# Patient Record
Sex: Female | Born: 1995 | Race: Black or African American | Hispanic: No | Marital: Single | Smoking: Never smoker
Health system: Southern US, Community
[De-identification: ages and names within clinical notes are randomized; demographics above are authoritative.]

## PROBLEM LIST (undated history)

## (undated) DIAGNOSIS — J45909 Unspecified asthma, uncomplicated: Secondary | ICD-10-CM

## (undated) HISTORY — PX: WISDOM TOOTH EXTRACTION: SHX21

---

## 2017-01-11 ENCOUNTER — Emergency Department: Payer: Worker's Compensation

## 2017-01-11 ENCOUNTER — Encounter: Payer: Self-pay | Admitting: Emergency Medicine

## 2017-01-11 ENCOUNTER — Emergency Department
Admission: EM | Admit: 2017-01-11 | Discharge: 2017-01-11 | Disposition: A | Discharge status: Home / Self Care | Payer: Worker's Compensation | Attending: Emergency Medicine | Admitting: Emergency Medicine

## 2017-01-11 DIAGNOSIS — Y92219 Unspecified school as the place of occurrence of the external cause: Secondary | ICD-10-CM | POA: Insufficient documentation

## 2017-01-11 DIAGNOSIS — Y939 Activity, unspecified: Secondary | ICD-10-CM | POA: Diagnosis not present

## 2017-01-11 DIAGNOSIS — S40022A Contusion of left upper arm, initial encounter: Secondary | ICD-10-CM | POA: Diagnosis not present

## 2017-01-11 DIAGNOSIS — Y998 Other external cause status: Secondary | ICD-10-CM | POA: Insufficient documentation

## 2017-01-11 DIAGNOSIS — J45909 Unspecified asthma, uncomplicated: Secondary | ICD-10-CM | POA: Diagnosis not present

## 2017-01-11 DIAGNOSIS — S4992XA Unspecified injury of left shoulder and upper arm, initial encounter: Secondary | ICD-10-CM | POA: Diagnosis present

## 2017-01-11 HISTORY — DX: Unspecified asthma, uncomplicated: J45.909

## 2017-01-11 MED ORDER — NAPROXEN 500 MG PO TABS
500.0000 mg | ORAL_TABLET | Freq: Once | ORAL | Status: AC
  Administered 2017-01-11: 500 mg via ORAL
  Filled 2017-01-11: qty 1

## 2017-01-11 MED ORDER — NAPROXEN 500 MG PO TABS: 500.0000 mg | ORAL_TABLET | Freq: Two times a day (BID) | ORAL | Status: DC
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
## 2017-01-11 NOTE — Discharge Instructions (Signed)
Arm sling for 2-3 days as needed °

## 2017-09-20 ENCOUNTER — Ambulatory Visit: Payer: BC Managed Care – PPO | Admitting: Allergy and Immunology

## 2017-09-20 ENCOUNTER — Encounter: Payer: Self-pay | Admitting: Allergy and Immunology

## 2017-09-20 VITALS — BP 108/70 | HR 80 | Temp 98.0°F | Resp 17 | Ht 62.0 in | Wt 115.0 lb

## 2017-09-20 DIAGNOSIS — J3089 Other allergic rhinitis: Secondary | ICD-10-CM | POA: Insufficient documentation

## 2017-09-20 DIAGNOSIS — J4540 Moderate persistent asthma, uncomplicated: Secondary | ICD-10-CM | POA: Insufficient documentation

## 2017-09-20 MED ORDER — MONTELUKAST SODIUM 10 MG PO TABS: 10.0000 mg | ORAL_TABLET | Freq: Every day | ORAL | 5 refills | Status: DC

## 2017-09-20 MED ORDER — FLUTICASONE PROPIONATE HFA 110 MCG/ACT IN AERO: 2.0000 | INHALATION_SPRAY | Freq: Two times a day (BID) | RESPIRATORY_TRACT | 5 refills | Status: DC

## 2017-09-20 MED ORDER — MOMETASONE FUROATE 50 MCG/ACT NA SUSP: 1.0000 | Freq: Two times a day (BID) | NASAL | 5 refills | Status: DC | PRN

## 2017-09-20 NOTE — Patient Instructions (Addendum)
Moderate persistent asthma Currently with suboptimal control.  In addition, today's spirometry results, assessed while asymptomatic, suggest under-perception of bronchoconstriction.  A prescription has been provided for Flovent (fluticasone) 110 g,  2 inhalations twice a day. To maximize pulmonary deposition, a spacer has been provided along with instructions for its proper administration with an HFA inhaler.  A prescription has been provided for montelukast 10 mg daily at bedtime.   Continue albuterol HFA, 1 to 2 inhalations every 6 hours if needed.  Subjective and objective measures of pulmonary function will be followed and the treatment plan will be adjusted accordingly.  Seasonal and perennial allergic rhinitis  Aeroallergen avoidance measures have been discussed and provided in written form.  Montelukast has been prescribed (as above).  A prescription has been provided for Nasonex nasal spray, one spray per nostril 1-2 times daily as needed. Proper nasal spray technique has been discussed and demonstrated.  Nasal saline spray (i.e., Simply Saline) or nasal saline lavage (i.e., NeilMed) is recommended as needed and prior to medicated nasal sprays.  If allergen avoidance measures and medications fail to adequately relieve symptoms, aeroallergen immunotherapy will be considered.   Return in about 3 months (around 12/20/2017), or if symptoms worsen or fail to improve.  Reducing Pollen Exposure  The American Academy of Allergy, Asthma and Immunology suggests the following steps to reduce your exposure to pollen during allergy seasons.    1. Do not hang sheets or clothing out to dry; pollen may collect on these items. 2. Do not mow lawns or spend time around freshly cut grass; mowing stirs up pollen. 3. Keep windows closed at night.  Keep car windows closed while driving. 4. Minimize morning activities outdoors, a time when pollen counts are usually at their highest. 5. Stay indoors  as much as possible when pollen counts or humidity is high and on windy days when pollen tends to remain in the air longer. 6. Use air conditioning when possible.  Many air conditioners have filters that trap the pollen spores. 7. Use a HEPA room air filter to remove pollen form the indoor air you breathe.   Control of House Dust Mite Allergen  House dust mites play a major role in allergic asthma and rhinitis.  They occur in environments with high humidity wherever human skin, the food for dust mites is found. High levels have been detected in dust obtained from mattresses, pillows, carpets, upholstered furniture, bed covers, clothes and soft toys.  The principal allergen of the house dust mite is found in its feces.  A gram of dust may contain 1,000 mites and 250,000 fecal particles.  Mite antigen is easily measured in the air during house cleaning activities.    1. Encase mattresses, including the box spring, and pillow, in an air tight cover.  Seal the zipper end of the encased mattresses with wide adhesive tape. 2. Wash the bedding in water of 130 degrees Farenheit weekly.  Avoid cotton comforters/quilts and flannel bedding: the most ideal bed covering is the dacron comforter. 3. Remove all upholstered furniture from the bedroom. 4. Remove carpets, carpet padding, rugs, and non-washable window drapes from the bedroom.  Wash drapes weekly or use plastic window coverings. 5. Remove all non-washable stuffed toys from the bedroom.  Wash stuffed toys weekly. 6. Have the room cleaned frequently with a vacuum cleaner and a damp dust-mop.  The patient should not be in a room which is being cleaned and should wait 1 hour after cleaning before going into  the room. 7. Close and seal all heating outlets in the bedroom.  Otherwise, the room will become filled with dust-laden air.  An electric heater can be used to heat the room. Reduce indoor humidity to less than 50%.  Do not use a humidifier.  Control  of Dog or Cat Allergen  Avoidance is the best way to manage a dog or cat allergy. If you have a dog or cat and are allergic to dog or cats, consider removing the dog or cat from the home. If you have a dog or cat but don't want to find it a new home, or if your family wants a pet even though someone in the household is allergic, here are some strategies that may help keep symptoms at bay:  1. Keep the pet out of your bedroom and restrict it to only a few rooms. Be advised that keeping the dog or cat in only one room will not limit the allergens to that room. 2. Don't pet, hug or kiss the dog or cat; if you do, wash your hands with soap and water. 3. High-efficiency particulate air (HEPA) cleaners run continuously in a bedroom or living room can reduce allergen levels over time. 4. Place electrostatic material sheet in the air inlet vent in the bedroom. 5. Regular use of a high-efficiency vacuum cleaner or a central vacuum can reduce allergen levels. 6. Giving your dog or cat a bath at least once a week can reduce airborne allergen.  Control of Mold Allergen  Mold and fungi can grow on a variety of surfaces provided certain temperature and moisture conditions exist.  Outdoor molds grow on plants, decaying vegetation and soil.  The major outdoor mold, Alternaria and Cladosporium, are found in very high numbers during hot and dry conditions.  Generally, a late Summer - Fall peak is seen for common outdoor fungal spores.  Rain will temporarily lower outdoor mold spore count, but counts rise rapidly when the rainy period ends.  The most important indoor molds are Aspergillus and Penicillium.  Dark, humid and poorly ventilated basements are ideal sites for mold growth.  The next most common sites of mold growth are the bathroom and the kitchen.  Outdoor [employer] 1. Use air conditioning and keep windows closed 2. Avoid exposure to decaying vegetation. 3. Avoid leaf raking. 4. Avoid grain  handling. 5. Consider wearing a face mask if working in moldy areas.  Indoor Mold Control 1. Maintain humidity below 50%. 2. Clean washable surfaces with 5% bleach solution. 3. Remove sources e.g. Contaminated carpets.  Control of Cockroach Allergen  Cockroach allergen has been identified as an important cause of acute attacks of asthma, especially in urban settings.  There are fifty-five species of cockroach that exist in the [place], however only three, the [place], [place] species produce allergen that can affect patients with Asthma.  Allergens can be obtained from fecal particles, egg casings and secretions from cockroaches.    1. Remove food sources. 2. Reduce access to water. 3. Seal access and entry points. 4. Spray runways with 0.5-1% Diazinon or Chlorpyrifos 5. Blow boric acid power under stoves and refrigerator. 6. Place bait stations (hydramethylnon) at feeding sites.

## 2017-09-20 NOTE — Assessment & Plan Note (Signed)
   Aeroallergen avoidance measures have been discussed and provided in written form.  Montelukast has been prescribed (as above).  A prescription has been provided for Nasonex nasal spray, one spray per nostril 1-2 times daily as needed. Proper nasal spray technique has been discussed and demonstrated.  Nasal saline spray (i.e., Simply Saline) or nasal saline lavage (i.e., NeilMed) is recommended as needed and prior to medicated nasal sprays.  If allergen avoidance measures and medications fail to adequately relieve symptoms, aeroallergen immunotherapy will be considered.

## 2017-09-20 NOTE — Assessment & Plan Note (Signed)
Currently with suboptimal control.  In addition, today's spirometry results, assessed while asymptomatic, suggest under-perception of bronchoconstriction.  A prescription has been provided for Flovent (fluticasone) 110 g, 2 inhalations twice a day. To maximize pulmonary deposition, a spacer has been provided along with instructions for its proper administration with an HFA inhaler.  A prescription has been provided for montelukast 10 mg daily at bedtime.   Continue albuterol HFA, 1 to 2 inhalations every 6 hours if needed.  Subjective and objective measures of pulmonary function will be followed and the treatment plan will be adjusted accordingly.

## 2017-09-20 NOTE — Progress Notes (Signed)
New Patient Note  RE: Kaitlin Sanchez MRN: [id number] DOB: December 17, 1995 Date of Office Visit: 09/20/2017  Referring provider: No ref. provider found Primary care provider: Patient, No Pcp Per  Chief Complaint: Asthma   History of present illness: Kaitlin Sanchez is a 22 y.o. female presenting today for evaluation of asthma. She reports that she was diagnosed with asthma during childhood.  The asthma symptoms progressed in frequency and severity and she was prescribed albuterol at the age 54.  She currently requires albuterol rescue 1-2 times per day on average during the pollen season and experiences nocturnal awakenings due to lower respiratory symptoms one night per week on average.  Specific triggers for her asthma symptoms include heat, upper respiratory tract infections, exposure to pollen, and exposure to cigarette smoke.  She experiences occasional nasal congestion.  No significant seasonal symptom variation has been noted nor have specific environmental triggers been identified.  She was diagnosed with asthma in childhood.  She currently requires albuterol rescue with exposure to pollens and cigarette smoke.  She does not experience nocturnal awakenings due to lower respiratory symptoms.  Assessment and plan: Moderate persistent asthma Currently with suboptimal control.  In addition, today's spirometry results, assessed while asymptomatic, suggest under-perception of bronchoconstriction.  A prescription has been provided for Flovent (fluticasone) 110 g,  2 inhalations twice a day. To maximize pulmonary deposition, a spacer has been provided along with instructions for its proper administration with an HFA inhaler.  A prescription has been provided for montelukast 10 mg daily at bedtime.   Continue albuterol HFA, 1 to 2 inhalations every 6 hours if needed.  Subjective and objective measures of pulmonary function will be followed and the treatment plan will be adjusted  accordingly.  Seasonal and perennial allergic rhinitis  Aeroallergen avoidance measures have been discussed and provided in written form.  Montelukast has been prescribed (as above).  A prescription has been provided for Nasonex nasal spray, one spray per nostril 1-2 times daily as needed. Proper nasal spray technique has been discussed and demonstrated.  Nasal saline spray (i.e., Simply Saline) or nasal saline lavage (i.e., NeilMed) is recommended as needed and prior to medicated nasal sprays.  If allergen avoidance measures and medications fail to adequately relieve symptoms, aeroallergen immunotherapy will be considered.   Meds ordered this encounter  Medications  . montelukast (SINGULAIR) 10 MG tablet    Sig: Take 1 tablet (10 mg total) by mouth at bedtime.    Dispense:  30 tablet    Refill:  5  . fluticasone (FLOVENT HFA) 110 MCG/ACT inhaler    Sig: Inhale 2 puffs into the lungs 2 (two) times daily.    Dispense:  1 Inhaler    Refill:  5  . mometasone (NASONEX) 50 MCG/ACT nasal spray    Sig: Place 1 spray into the nose 2 (two) times daily as needed.    Dispense:  17 g    Refill:  5    Diagnostics: Spirometry: FVC was 3.58 L and FEV1 was 2.59 L, FEV1 ratio of 86%.  There was significant (320 mL, 12%) postbronchodilator improvement.  This study was performed while the patient was asymptomatic.  Please see scanned spirometry results for details. Epicutaneous testing: Positive to grass pollen, weed pollen, ragweed pollen, tree pollen, molds, cat hair, and dust mite antigen. Intradermal testing: Positive to cockroach antigen.    Physical examination: Blood pressure 108/70, pulse 80, temperature 98 F (36.7 C), resp. rate 17, height 5\' 2"  (1.575 m), weight 115  lb (52.2 kg), SpO2 99 %.  General: Alert, interactive, in no acute distress. HEENT: TMs pearly gray, turbinates edematous with clear discharge, post-pharynx moderately erythematous. Neck: Supple without  lymphadenopathy. Lungs: Clear to auscultation without wheezing, rhonchi or rales. CV: Normal S1, S2 without murmurs. Abdomen: Nondistended, nontender. Skin: Warm and dry, without lesions or rashes. Extremities:  No clubbing, cyanosis or edema. Neuro:   Grossly intact.  Review of systems:  Review of systems negative except as noted in HPI / PMHx or noted below: Review of Systems  Constitutional: Negative.   HENT: Negative.   Eyes: Negative.   Respiratory: Negative.   Cardiovascular: Negative.   Gastrointestinal: Negative.   Genitourinary: Negative.   Musculoskeletal: Negative.   Skin: Negative.   Neurological: Negative.   Endo/Heme/Allergies: Negative.   Psychiatric/Behavioral: Negative.     Past medical history:  Past Medical History:  Diagnosis Date  . Asthma     Past surgical history:  Past Surgical History:  Procedure Laterality Date  . WISDOM TOOTH EXTRACTION      Family history: Family History  Problem Relation Age of Onset  . Asthma Father   . Asthma Brother   . Allergic rhinitis Brother     Social history: Social History   Socioeconomic History  . Marital status: Single    Spouse name: Not on file  . Number of children: Not on file  . Years of education: Not on file  . Highest education level: Not on file  Occupational History  . Not on file  Social Needs  . Financial resource strain: Not on file  . Food insecurity:    Worry: Not on file    Inability: Not on file  . Transportation needs:    Medical: Not on file    Non-medical: Not on file  Tobacco Use  . Smoking status: Never Smoker  . Smokeless tobacco: Never Used  Substance and Sexual Activity  . Alcohol use: Yes  . Drug use: Not on file  . Sexual activity: Not on file  Lifestyle  . Physical activity:    Days per week: Not on file    Minutes per session: Not on file  . Stress: Not on file  Relationships  . Social connections:    Talks on phone: Not on file    Gets together: Not  on file    Attends religious service: Not on file    Active member of club or organization: Not on file    Attends meetings of clubs or organizations: Not on file    Relationship status: Not on file  . Intimate partner violence:    Fear of current or ex partner: Not on file    Emotionally abused: Not on file    Physically abused: Not on file    Forced sexual activity: Not on file  Other Topics Concern  . Not on file  Social History Narrative  . Not on file   Environmental History: The patient lives in a townhouse with carpeting throughout and central air/heat.  There is no known mold/water damage in the home.  She is a non-smoker without pets.  Allergies as of 09/20/2017   No Known Allergies     Medication List        Accurate as of 09/20/17  6:35 PM. Always use your most recent med list.          fluticasone 110 MCG/ACT inhaler Commonly known as:  FLOVENT HFA Inhale 2 puffs into the lungs 2 (two)  times daily.   mometasone 50 MCG/ACT nasal spray Commonly known as:  NASONEX Place 1 spray into the nose 2 (two) times daily as needed.   montelukast 10 MG tablet Commonly known as:  SINGULAIR Take 1 tablet (10 mg total) by mouth at bedtime.   PROVENTIL HFA 108 (90 Base) MCG/ACT inhaler Generic drug:  albuterol Inhale into the lungs.       Known medication allergies: No Known Allergies  I appreciate the opportunity to take part in Kaitlin Sanchez's care. Please do not hesitate to contact me with questions.  Sincerely,   R. Jorene Guestarter Teriana Danker, MD

## 2017-10-26 ENCOUNTER — Telehealth: Payer: Self-pay | Admitting: Allergy and Immunology

## 2017-10-26 NOTE — Telephone Encounter (Signed)
Spoke to patient advised we could not do 1 yr supply due to insurance. Insurance might not pay also she will be due for an appointment. Patient made appt for 11/22/17 @ 345 to discuss medication regimen with Dr Nunzio Cobbs.

## 2017-10-26 NOTE — Telephone Encounter (Signed)
Pt called and needs to have her proair refilled. And [place] 1 year supply of all her meds. [facility]. [phone].

## 2017-11-22 ENCOUNTER — Other Ambulatory Visit: Payer: Self-pay

## 2017-11-22 ENCOUNTER — Ambulatory Visit (INDEPENDENT_AMBULATORY_CARE_PROVIDER_SITE_OTHER): Payer: BC Managed Care – PPO | Admitting: Allergy and Immunology

## 2017-11-22 ENCOUNTER — Encounter: Payer: Self-pay | Admitting: Allergy and Immunology

## 2017-11-22 VITALS — BP 98/70 | HR 84 | Resp 16

## 2017-11-22 DIAGNOSIS — J4540 Moderate persistent asthma, uncomplicated: Secondary | ICD-10-CM | POA: Diagnosis not present

## 2017-11-22 DIAGNOSIS — J3089 Other allergic rhinitis: Secondary | ICD-10-CM

## 2017-11-22 DIAGNOSIS — H1013 Acute atopic conjunctivitis, bilateral: Secondary | ICD-10-CM

## 2017-11-22 DIAGNOSIS — H1010 Acute atopic conjunctivitis, unspecified eye: Secondary | ICD-10-CM | POA: Insufficient documentation

## 2017-11-22 MED ORDER — MOMETASONE FUROATE 50 MCG/ACT NA SUSP: 1.0000 | Freq: Two times a day (BID) | NASAL | 3 refills | Status: DC | PRN

## 2017-11-22 MED ORDER — MONTELUKAST SODIUM 10 MG PO TABS: 10.0000 mg | ORAL_TABLET | Freq: Every day | ORAL | 3 refills | Status: DC

## 2017-11-22 MED ORDER — FLUTICASONE PROPIONATE HFA 110 MCG/ACT IN AERO: 2.0000 | INHALATION_SPRAY | Freq: Two times a day (BID) | RESPIRATORY_TRACT | 3 refills | Status: DC

## 2017-11-22 MED ORDER — MOMETASONE FUROATE 50 MCG/ACT NA SUSP: 1.0000 | Freq: Two times a day (BID) | NASAL | 3 refills | Status: AC | PRN

## 2017-11-22 MED ORDER — FLUTICASONE PROPIONATE HFA 110 MCG/ACT IN AERO: 2.0000 | INHALATION_SPRAY | Freq: Two times a day (BID) | RESPIRATORY_TRACT | 3 refills | Status: AC

## 2017-11-22 MED ORDER — ALBUTEROL SULFATE HFA 108 (90 BASE) MCG/ACT IN AERS: 2.0000 | INHALATION_SPRAY | RESPIRATORY_TRACT | 3 refills | Status: AC | PRN

## 2017-11-22 MED ORDER — ALBUTEROL SULFATE HFA 108 (90 BASE) MCG/ACT IN AERS
2.0000 | INHALATION_SPRAY | RESPIRATORY_TRACT | 3 refills | Status: DC | PRN
Start: 2017-11-22 — End: 2017-11-22

## 2017-11-22 MED ORDER — MONTELUKAST SODIUM 10 MG PO TABS: 10.0000 mg | ORAL_TABLET | Freq: Every day | ORAL | 3 refills | Status: AC

## 2017-11-22 MED ORDER — ALBUTEROL SULFATE HFA 108 (90 BASE) MCG/ACT IN AERS: 2.0000 | INHALATION_SPRAY | RESPIRATORY_TRACT | 3 refills | Status: DC | PRN

## 2017-11-22 NOTE — Assessment & Plan Note (Signed)
Currently well controlled.  Continue Flovent 110 g, 2 inhalations twice daily, montelukast 10 mg daily bedtime, and albuterol HFA, 1 to 2 inhalations every 6 hours if needed.  To maximize pulmonary deposition, a spacer has been provided along with instructions for its proper administration with an HFA inhaler.  Refill prescriptions have been provided.  Subjective and objective measures of pulmonary function will be followed and the treatment plan will be adjusted accordingly.

## 2017-11-22 NOTE — Assessment & Plan Note (Signed)
Well-controlled and stable.  Continue appropriate aeroallergen avoidance measures, montelukast 10 mg daily bedtime, Nasonex as needed, and nasal saline irrigation as needed.  Refills have been provided.  If allergen avoidance measures and medications fail to adequately relieve symptoms, aeroallergen immunotherapy will be considered.

## 2017-11-22 NOTE — Patient Instructions (Addendum)
Moderate persistent asthma Currently well controlled.  Continue Flovent 110 g, 2 inhalations twice daily, montelukast 10 mg daily bedtime, and albuterol HFA, 1 to 2 inhalations every 6 hours if needed.  To maximize pulmonary deposition, a spacer has been provided along with instructions for its proper administration with an HFA inhaler.  Refill prescriptions have been provided.  Subjective and objective measures of pulmonary function will be followed and the treatment plan will be adjusted accordingly.  Seasonal and perennial allergic rhinitis Well-controlled and stable.  Continue appropriate aeroallergen avoidance measures, montelukast 10 mg daily bedtime, Nasonex as needed, and nasal saline irrigation as needed.  Refills have been provided.  If allergen avoidance measures and medications fail to adequately relieve symptoms, aeroallergen immunotherapy will be considered.   Return in about 5 months (around 04/24/2018).

## 2017-11-22 NOTE — Progress Notes (Signed)
Follow-up Note  RE: Kaitlin Sanchez MRN: [id number] DOB: 07/29/1995 Date of Office Visit: 11/22/2017  Primary care provider: Patient, No Pcp Per Referring provider: No ref. provider found  History of present illness: Kaitlin Sanchez is a 22 y.o. female with persistent asthma and allergic rhinitis presenting today for follow-up.  She was previously seen in this clinic for initial evaluation on September 20, 2017.  She reports that in the interval since her previous visit her upper and lower respiratory symptoms have improved and have been well controlled.  While taking Flovent 110 g, 2 inhalations twice daily, and montelukast 10 mg daily bedtime, she has rarely required albuterol rescue, does not experience nocturnal awakenings due to lower respiratory symptoms, and does not experience limitations in normal daily activities.  She admits that she has not been using a spacer device as previously recommended.  She has no nasal symptom complaints today.  She reports that she will be doing an [employer] [place] program in [place] for the next year and will need refills to last her throughout the year, though she is hoping to make it back to the [place] at some point during the year and will follow-up in this office if possible.  Assessment and plan: Moderate persistent asthma Currently well controlled.  Continue Flovent 110 g, 2 inhalations twice daily, montelukast 10 mg daily bedtime, and albuterol HFA, 1 to 2 inhalations every 6 hours if needed.  To maximize pulmonary deposition, a spacer has been provided along with instructions for its proper administration with an HFA inhaler.  Refill prescriptions have been provided.  Subjective and objective measures of pulmonary function will be followed and the treatment plan will be adjusted accordingly.  Seasonal and perennial allergic rhinitis Well-controlled and stable.  Continue appropriate aeroallergen avoidance measures, montelukast 10 mg daily  bedtime, Nasonex as needed, and nasal saline irrigation as needed.  Refills have been provided.  If allergen avoidance measures and medications fail to adequately relieve symptoms, aeroallergen immunotherapy will be considered.   Meds ordered this encounter  Medications  . DISCONTD: montelukast (SINGULAIR) 10 MG tablet    Sig: Take 1 tablet (10 mg total) by mouth at bedtime.    Dispense:  90 tablet    Refill:  3  . DISCONTD: mometasone (NASONEX) 50 MCG/ACT nasal spray    Sig: Place 1 spray into the nose 2 (two) times daily as needed.    Dispense:  51 g    Refill:  3  . DISCONTD: fluticasone (FLOVENT HFA) 110 MCG/ACT inhaler    Sig: Inhale 2 puffs into the lungs 2 (two) times daily.    Dispense:  3 Inhaler    Refill:  3  . DISCONTD: albuterol (PROVENTIL HFA) 108 (90 Base) MCG/ACT inhaler    Sig: Inhale 2 puffs into the lungs every 4 (four) hours as needed for wheezing or shortness of breath.    Dispense:  3 Inhaler    Refill:  3    Diagnostics: Spirometry: FVC is 4.09 L and FEV1 is 2.74 L (102% predicted) with an 80% FEV1.  This study was performed while the patient was asymptomatic.  Please see scanned spirometry results for details.    Physical examination: Blood pressure 98/70, pulse 84, resp. rate 16, SpO2 97 %.  General: Alert, interactive, in no acute distress. HEENT: TMs pearly gray, turbinates mildly edematous without discharge, post-pharynx unremarkable. Neck: Supple without lymphadenopathy. Lungs: Clear to auscultation without wheezing, rhonchi or rales. CV: Normal S1, S2 without murmurs. Skin:  Warm and dry, without lesions or rashes.  The following portions of the patient's history were reviewed and updated as appropriate: allergies, current medications, past family history, past medical history, past social history, past surgical history and problem list.  Allergies as of 11/22/2017   No Known Allergies     Medication List        Accurate as of 11/22/17   7:54 PM. Always use your most recent med list.          albuterol 108 (90 Base) MCG/ACT inhaler Commonly known as:  PROVENTIL HFA Inhale 2 puffs into the lungs every 4 (four) hours as needed for wheezing or shortness of breath.   fluticasone 110 MCG/ACT inhaler Commonly known as:  FLOVENT HFA Inhale 2 puffs into the lungs 2 (two) times daily.   mometasone 50 MCG/ACT nasal spray Commonly known as:  NASONEX Place 1 spray into the nose 2 (two) times daily as needed.   montelukast 10 MG tablet Commonly known as:  SINGULAIR Take 1 tablet (10 mg total) by mouth at bedtime.       No Known Allergies  I appreciate the opportunity to take part in Kaitlin Sanchez's care. Please do not hesitate to contact me with questions.  Sincerely,   R. Jorene Guest, MD
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
